# Patient Record
Sex: Male | Born: 1945 | Race: White | Hispanic: No | Marital: Married | Smoking: Never smoker
Health system: Southern US, Community
[De-identification: ages and names within clinical notes are randomized; demographics above are authoritative.]

## PROBLEM LIST (undated history)

## (undated) DIAGNOSIS — E7800 Pure hypercholesterolemia, unspecified: Secondary | ICD-10-CM

## (undated) DIAGNOSIS — E079 Disorder of thyroid, unspecified: Secondary | ICD-10-CM

---

## 2012-05-07 DIAGNOSIS — R030 Elevated blood-pressure reading, without diagnosis of hypertension: Secondary | ICD-10-CM | POA: Insufficient documentation

## 2015-12-17 DIAGNOSIS — M5431 Sciatica, right side: Secondary | ICD-10-CM | POA: Insufficient documentation

## 2017-06-03 DIAGNOSIS — S0181XA Laceration without foreign body of other part of head, initial encounter: Secondary | ICD-10-CM | POA: Insufficient documentation

## 2018-07-24 ENCOUNTER — Ambulatory Visit (HOSPITAL_COMMUNITY)
Admission: EM | Admit: 2018-07-24 | Discharge: 2018-07-24 | Disposition: A | Discharge status: Home / Self Care | Payer: Medicare Other | Attending: Family Medicine | Admitting: Family Medicine

## 2018-07-24 ENCOUNTER — Encounter (HOSPITAL_COMMUNITY): Payer: Self-pay | Admitting: Emergency Medicine

## 2018-07-24 ENCOUNTER — Ambulatory Visit (INDEPENDENT_AMBULATORY_CARE_PROVIDER_SITE_OTHER): Payer: Medicare Other

## 2018-07-24 DIAGNOSIS — N200 Calculus of kidney: Secondary | ICD-10-CM | POA: Insufficient documentation

## 2018-07-24 DIAGNOSIS — E7800 Pure hypercholesterolemia, unspecified: Secondary | ICD-10-CM | POA: Insufficient documentation

## 2018-07-24 DIAGNOSIS — E079 Disorder of thyroid, unspecified: Secondary | ICD-10-CM | POA: Insufficient documentation

## 2018-07-24 DIAGNOSIS — R350 Frequency of micturition: Secondary | ICD-10-CM

## 2018-07-24 DIAGNOSIS — R109 Unspecified abdominal pain: Secondary | ICD-10-CM | POA: Diagnosis present

## 2018-07-24 DIAGNOSIS — R1031 Right lower quadrant pain: Secondary | ICD-10-CM | POA: Diagnosis not present

## 2018-07-24 HISTORY — DX: Disorder of thyroid, unspecified: E07.9

## 2018-07-24 HISTORY — DX: Pure hypercholesterolemia, unspecified: E78.00

## 2018-07-24 LAB — POCT URINALYSIS DIP (DEVICE)
BILIRUBIN URINE: NEGATIVE
Glucose, UA: NEGATIVE mg/dL
Ketones, ur: NEGATIVE mg/dL
Nitrite: NEGATIVE
Protein, ur: NEGATIVE mg/dL
Specific Gravity, Urine: >=1.03 (ref 1.005–1.030)
Urobilinogen, UA: 0.2 mg/dL (ref 0.0–1.0)
pH: 5 (ref 5.0–8.0)

## 2018-07-24 MED ORDER — TRAMADOL HCL 50 MG PO TABS: 50.0000 mg | ORAL_TABLET | Freq: Four times a day (QID) | ORAL | 0 refills | Status: AC | PRN

## 2018-07-24 MED ORDER — TAMSULOSIN HCL 0.4 MG PO CAPS: 0.4000 mg | ORAL_CAPSULE | Freq: Every day | ORAL | 0 refills | Status: AC

## 2018-07-24 NOTE — ED Triage Notes (Signed)
Pt c/o R flank pain, constantly having to urinate since 2am this morning.

## 2018-07-24 NOTE — ED Provider Notes (Addendum)
Patient: Jason Weiss MRN: [id number] DOB: [birth year] PCP: Patient, No Pcp Per     Subjective:  Chief Complaint  Patient presents with  . Appointment    12  . Urinary Frequency  . Flank Pain    HPI: The patient is a 72 y.o. male who presents today for flank pain/urinary frequency that started around 2AM. He thought he pulled a muscle. Pain is on his right side. He feels like he is needing to urinate every 10 minutes and can barely get anything out. No visible blood in his urine and no pain/dsysuria with urination. Right flank pain rated as 5/10 and is constant dull pain. Last night pain was worse. Pain does not radiate. He did take tylenol on the way over here. Pain not increased with movement/walking. No history of stones.   Review of Systems  Constitutional: Negative for chills and fever.  Respiratory: Negative for cough, shortness of breath and wheezing.   Cardiovascular: Negative for chest pain, palpitations and leg swelling.  Gastrointestinal: Negative for diarrhea, nausea and vomiting.  Genitourinary: Positive for difficulty urinating, flank pain (right) and frequency. Negative for dysuria, hematuria and urgency.    Allergies Patient has No Known Allergies.  Past Medical History Patient  has a past medical history of High cholesterol and Thyroid disease.  Surgical History Patient  has no past surgical history on file.  Family History Pateint's Family history is unknown by patient.  Social History Patient  reports that he has never smoked. He does not have any smokeless tobacco history on file. He reports that he does not drink alcohol or use drugs.    Objective: Vitals:   07/24/18 1207  BP: (!) 146/73  Pulse: 60  Resp: 16  Temp: 98 F (36.7 C)  SpO2: 98%    There is no height or weight on file to calculate BMI.  Physical Exam Vitals signs reviewed.  Constitutional:      General: He is not in acute distress.    Appearance: Normal appearance. He is not  ill-appearing.  Neck:     Musculoskeletal: Normal range of motion and neck supple.  Cardiovascular:     Rate and Rhythm: Normal rate and regular rhythm.     Heart sounds: Normal heart sounds.  Pulmonary:     Effort: Pulmonary effort is normal.     Breath sounds: Normal breath sounds.  Abdominal:     General: Abdomen is flat. Bowel sounds are normal.     Palpations: Abdomen is soft.  Musculoskeletal:     Comments: +right CVA tenderness   Skin:    General: Skin is dry.     Capillary Refill: Capillary refill takes less than 2 seconds.  Neurological:     Mental Status: He is alert.  Psychiatric:        Mood and Affect: Mood normal.        Behavior: Behavior normal.    UA: large blood, few LE  abdo xray: no obvious stone seen. offiicial read pending.     Assessment/plan: Kidney stone  -concern for right stone. Giving him strainer, starting flomax and very strict ER precautions given for fever/worsening pain, N/V.  Can follow up with PCP when back home or ER if gets worse.  Tramadol for breakthrough pain. 15 pills given.    Orland MustardAllison Elvena Oyer, MD  07/24/2018    Orland MustardWolfe, Carrissa Taitano, MD 07/24/18 1312    Orland MustardWolfe, Kindrick Lankford, MD 07/24/18 1730

## 2018-07-24 NOTE — Discharge Instructions (Signed)
Concern for stone on right side. -sending in flomax to help open up vessels to help pass stone.  -checking urine culture.  -if you have fever, worsening pain, vomiting GO TO ER.   Other wise f/u with PCP on Monday.

## 2018-07-25 LAB — URINE CULTURE: Culture: NO GROWTH

## 2018-08-29 ENCOUNTER — Other Ambulatory Visit (HOSPITAL_COMMUNITY): Payer: Self-pay | Admitting: Family Medicine

## 2019-10-18 IMAGING — DX DG ABDOMEN 1V
2 series · 2 of 2 positions shown · non-contrast
Comparison: None.

CLINICAL DATA: Right-sided flank pain.  Frequent urination.

EXAM:
ABDOMEN - 1 VIEW

[abdomen kub (1 of 2)]
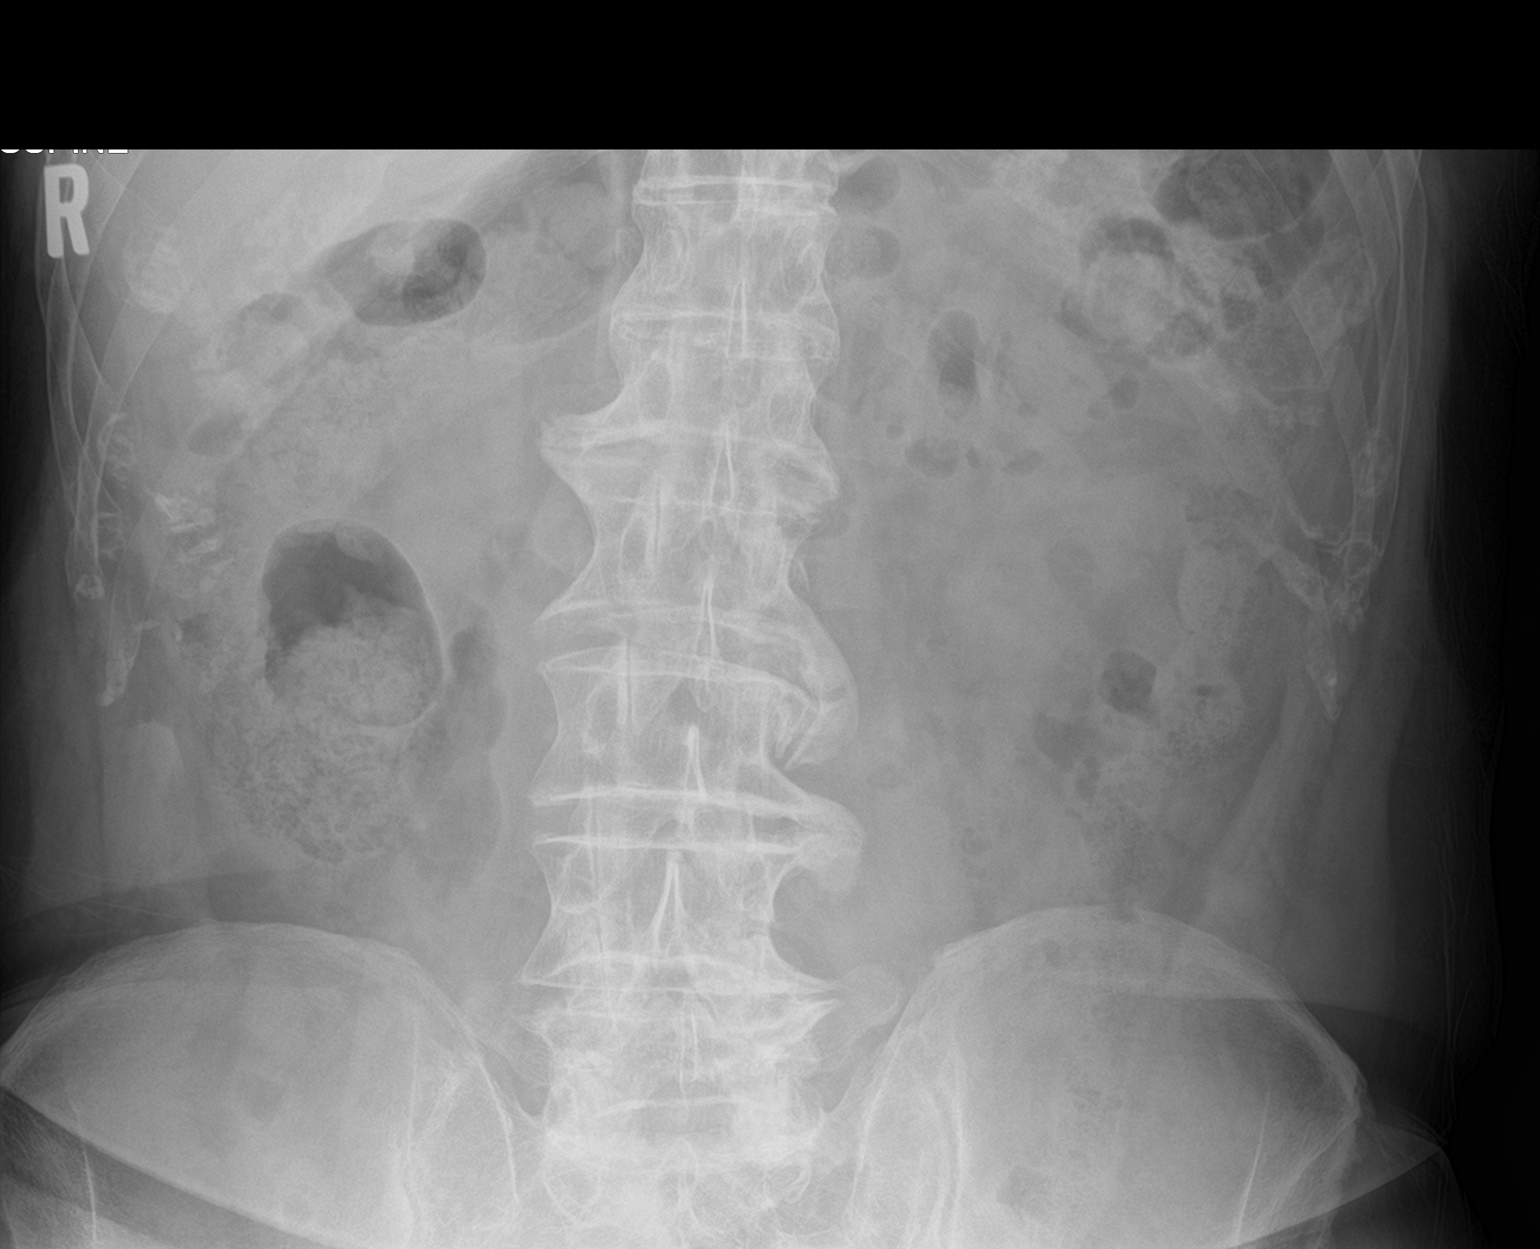

[abdomen kub (2 of 2)]
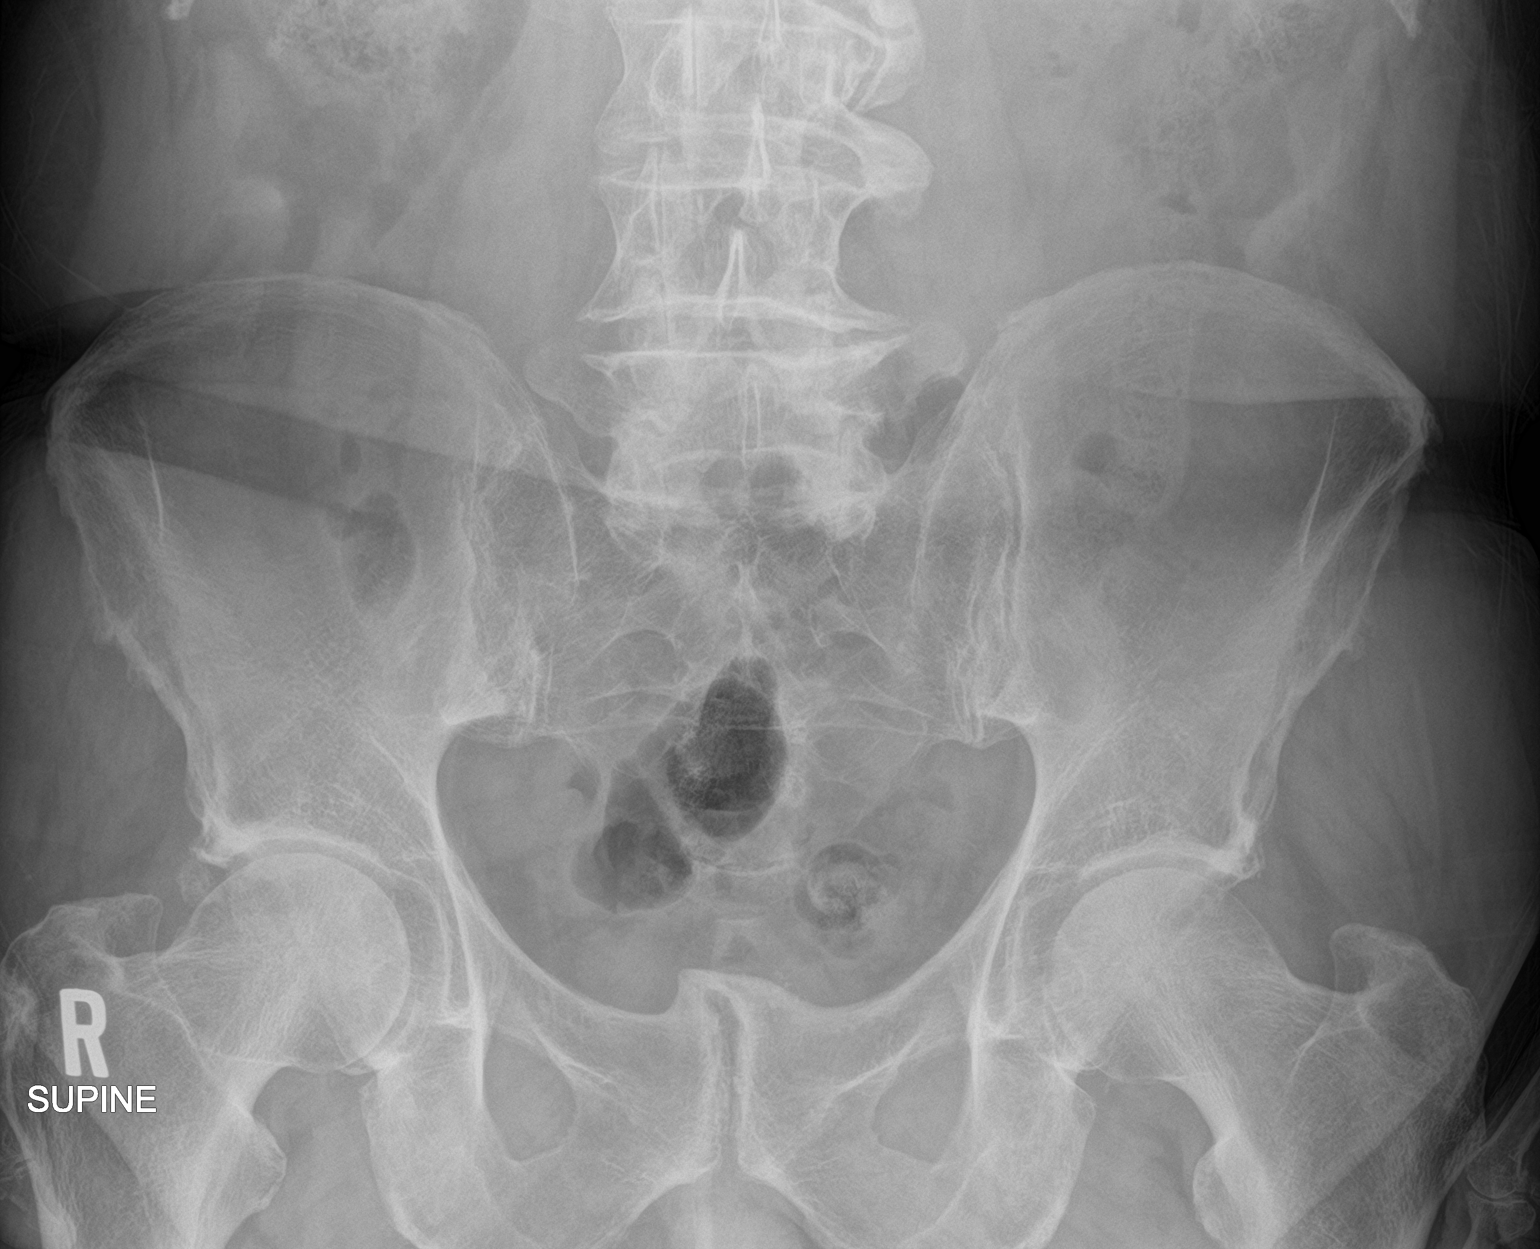

[2 of 2 positions shown; findings below may reference images not displayed]

FINDINGS: Both kidneys are partially obscured by bowel contents. No renal
stones are noted. No ureteral stones seen along the course of either
ureter. No other significant abnormalities.
IMPRESSION: No renal or ureteral stones noted. No acute abnormalities
identified.
